# Patient Record
Sex: Female | Born: 1960 | Race: White | Hispanic: No | Marital: Married | Smoking: Never smoker
Health system: Southern US, Community
[De-identification: ages and names within clinical notes are randomized; demographics above are authoritative.]

## PROBLEM LIST (undated history)

## (undated) DIAGNOSIS — C801 Malignant (primary) neoplasm, unspecified: Secondary | ICD-10-CM

## (undated) HISTORY — PX: BREAST SURGERY: SHX581

## (undated) HISTORY — PX: ABDOMINAL HYSTERECTOMY: SHX81

## (undated) HISTORY — PX: GASTRIC BYPASS: SHX52

## (undated) HISTORY — PX: RE-EXCISION OF BREAST CANCER,SUPERIOR MARGINS: SHX6047

## (undated) HISTORY — PX: CARPAL TUNNEL RELEASE: SHX101

---

## 2016-07-03 HISTORY — PX: MASTECTOMY: SHX3

## 2018-01-30 ENCOUNTER — Ambulatory Visit
Admission: EM | Admit: 2018-01-30 | Discharge: 2018-01-30 | Disposition: A | Discharge status: Home / Self Care | Payer: BLUE CROSS/BLUE SHIELD | Attending: Family Medicine | Admitting: Family Medicine

## 2018-01-30 ENCOUNTER — Other Ambulatory Visit: Payer: Self-pay

## 2018-01-30 DIAGNOSIS — J0100 Acute maxillary sinusitis, unspecified: Secondary | ICD-10-CM

## 2018-01-30 MED ORDER — AMOXICILLIN-POT CLAVULANATE 875-125 MG PO TABS: 1.0000 | ORAL_TABLET | Freq: Two times a day (BID) | ORAL | 0 refills | Status: DC

## 2018-01-30 NOTE — Discharge Instructions (Addendum)
Take medication as prescribed. Rest. Drink plenty of fluids.  ° °Follow up with your primary care physician this week as needed. Return to Urgent care for new or worsening concerns.  ° °

## 2018-01-30 NOTE — ED Provider Notes (Signed)
[facility] ____________________________________________  Time seen: Approximately 8:39 AM  I have reviewed the triage vital signs and the nursing notes.   HISTORY  Chief Complaint Otalgia and Sinusitis   HPI Carla Zuniga is a 57 y.o. female presenting for evaluation of 1 week of runny nose, nasal congestion postnasal drainage and some coughing.  Patient reports she has had sinus pressure sensation as well.  States over the last 3 to 4 days symptoms have been worsening and not improving, leading to increased sinus pain and sinus pressure sensation.  Has not been taking any over-the-counter medications regularly.  States that this is something fairly routinely change of seasons.  Unsure if she has seasonal allergies.  Continues to eat and drink well.  States some sore throat.  Continue to remain active.  Denies other aggravating or alleviating factors.Denies chest pain, shortness of breath, abdominal pain, or rash. Denies recent sickness. Denies recent antibiotic use.    History reviewed. No pertinent past medical history.  There are no active problems to display for this patient.   Past Surgical History:  Procedure Laterality Date  . ABDOMINAL HYSTERECTOMY    . CARPAL TUNNEL RELEASE Left   . GASTRIC BYPASS    . MASTECTOMY Right 2018     No current facility-administered medications for this encounter.   Current Outpatient Medications:  .  letrozole (FEMARA) 2.5 MG tablet, , Disp: , Rfl: 10 .  LORazepam (ATIVAN) 1 MG tablet, , Disp: , Rfl: 0 .  omeprazole (PRILOSEC) 20 MG capsule, , Disp: , Rfl: 4 .  amoxicillin-clavulanate (AUGMENTIN) 875-125 MG tablet, Take 1 tablet by mouth every 12 (twelve) hours., Disp: 20 tablet, Rfl: 0  Allergies Patient has no known allergies.  Family History  Problem Relation Age of Onset  . Hypertension Mother   . Diabetes Mother   . Hyperlipidemia Mother   . Hypertension Father   . Diabetes Father   . Hyperlipidemia Father      Social History Social History   Tobacco Use  . Smoking status: Never Smoker  . Smokeless tobacco: Never Used  Substance Use Topics  . Alcohol use: Not Currently  . Drug use: Not Currently    Review of Systems Constitutional: No fever/chills ENT: as above. Cardiovascular: Denies chest pain. Respiratory: Denies shortness of breath. Gastrointestinal: No abdominal pain.  Musculoskeletal: Negative for back pain. Skin: Negative for rash.  ____________________________________________   PHYSICAL EXAM:  VITAL SIGNS: ED Triage Vitals  Enc Vitals Group     BP 01/30/18 0815 (!) 145/65     Pulse Rate 01/30/18 0815 78     Resp 01/30/18 0815 18     Temp 01/30/18 0815 98.5 F (36.9 C)     Temp Source 01/30/18 0815 Oral     SpO2 01/30/18 0815 98 %     Weight 01/30/18 0813 150 lb (68 kg)     Height 01/30/18 0813 5\' 6"  (1.676 m)     Head Circumference --      Peak Flow --      Pain Score 01/30/18 0813 5     Pain Loc --      Pain Edu? --      Excl. in [place]? --     Constitutional: Alert and oriented. Well appearing and in no acute distress. Eyes: Conjunctivae are normal.  Head: Atraumatic.Mild to moderate tenderness to palpation bilateral frontal and maxillary sinuses. No swelling. No erythema.   Ears: no erythema, normal TMs bilaterally.   Nose: nasal congestion  with bilateral nasal turbinate erythema and edema.   Mouth/Throat: Mucous membranes are moist.  Oropharynx non-erythematous.No tonsillar swelling or exudate.  Neck: No stridor.  No cervical spine tenderness to palpation. Hematological/Lymphatic/Immunilogical: No cervical lymphadenopathy. Cardiovascular: Normal rate, regular rhythm. Grossly normal heart sounds.  Good peripheral circulation. Respiratory: Normal respiratory effort.  No retractions.No wheezes, rales or rhonchi. Good air movement.  Musculoskeletal: Steady gait. Neurologic:  Normal speech and language. No gait instability. Skin:  Skin is warm, dry and  intact. No rash noted. Psychiatric: Mood and affect are normal. Speech and behavior are normal.  ___________________________________________   LABS (all labs ordered are listed, but only abnormal results are displayed)  Labs Reviewed - No data to display  PROCEDURES Procedures  INITIAL IMPRESSION / ASSESSMENT AND PLAN / ED COURSE  Pertinent labs & imaging results that were available during my care of the patient were reviewed by me and considered in my medical decision making (see chart for details).  Well-appearing patient.  No acute distress.  Suspect sinusitis.  Will treat with oral Augmentin.  Encourage over-the-counter Claritin and Zyrtec, rest, fluids and supportive care.Discussed indication, risks and benefits of medications with patient.  Discussed follow up with Primary care physician this week. Discussed follow up and return parameters including no resolution or any worsening concerns. Patient verbalized understanding and agreed to plan.   ____________________________________________   FINAL CLINICAL IMPRESSION(S) / ED DIAGNOSES  Final diagnoses:  Acute maxillary sinusitis, recurrence not specified     ED Discharge Orders        Ordered    amoxicillin-clavulanate (AUGMENTIN) 875-125 MG tablet  Every 12 hours     01/30/18 0828       Note: This dictation was prepared with Dragon dictation along with smaller phrase technology. Any transcriptional errors that result from this process are unintentional.         Marylene Land, NP 01/30/18 [phone]

## 2018-01-30 NOTE — ED Triage Notes (Signed)
Patient complains of bilateral ear pain, sinus pain and pressure x 1 week, worsening 4 days ago.

## 2018-03-04 ENCOUNTER — Ambulatory Visit
Admission: EM | Admit: 2018-03-04 | Discharge: 2018-03-04 | Disposition: A | Discharge status: Home / Self Care | Payer: BLUE CROSS/BLUE SHIELD | Attending: Family Medicine | Admitting: Family Medicine

## 2018-03-04 ENCOUNTER — Ambulatory Visit (INDEPENDENT_AMBULATORY_CARE_PROVIDER_SITE_OTHER): Payer: BLUE CROSS/BLUE SHIELD

## 2018-03-04 ENCOUNTER — Encounter: Payer: Self-pay | Admitting: Gynecology

## 2018-03-04 DIAGNOSIS — S9031XA Contusion of right foot, initial encounter: Secondary | ICD-10-CM

## 2018-03-04 HISTORY — DX: Malignant (primary) neoplasm, unspecified: C80.1

## 2018-03-04 MED ORDER — KETOROLAC TROMETHAMINE 60 MG/2ML IM SOLN
60.0000 mg | Freq: Once | INTRAMUSCULAR | Status: AC
  Administered 2018-03-04: 60 mg via INTRAMUSCULAR

## 2018-03-04 NOTE — ED Provider Notes (Signed)
[facility] ____________________________________________  Time seen: Approximately 2:43 PM  I have reviewed the triage vital signs and the nursing notes.   HISTORY  Chief Complaint Toe Injury  HPI Carla Zuniga is a 57 y.o. female presenting for evaluation of right foot pain present since Friday.  States pain is at the base of her second third and fourth toes.  States she was walking outside on Friday and stepped on the area where paver was popped up.  States that soon as she stepped on the paver she had immediate pain to her foot.  Was wearing tennis shoes.  Denies any foreign body entrance.  States pain was predominantly at the bottom of her foot initially, now pain seems more on the top of her foot.  States occasional pain radiation towards her ankle and lower leg when the painful area is touched.  States sometimes these 3 toes feel tingling, no loss of sensation.  Denies any actual pain otherwise to her right leg.  Has applied ice but has continued to remain active and ambulatory on the area.  Did take some over-the-counter Tylenol without much change.  Denies other aggravating or alleviating factors.  Denies other injuries. Denies recent sickness. Denies recent antibiotic use.  Denies known osteoporosis.   Past Medical History:  Diagnosis Date  . Cancer [facility])    breast    There are no active problems to display for this patient.   Past Surgical History:  Procedure Laterality Date  . ABDOMINAL HYSTERECTOMY    . BREAST SURGERY    . CARPAL TUNNEL RELEASE Left   . GASTRIC BYPASS    . MASTECTOMY Right 2018  . RE-EXCISION OF BREAST CANCER,SUPERIOR MARGINS        Current Facility-Administered Medications:  .  ketorolac (TORADOL) injection 60 mg, 60 mg, Intramuscular, Once, Marylene Land, NP  Current Outpatient Medications:  .  ibandronate (BONIVA) 150 MG tablet, Take by mouth., Disp: , Rfl:  .  letrozole (FEMARA) 2.5 MG tablet, , Disp: , Rfl: 10 .  LORazepam  (ATIVAN) 1 MG tablet, , Disp: , Rfl: 0 .  omeprazole (PRILOSEC) 20 MG capsule, , Disp: , Rfl: 4 .  ALBUTEROL SULFATE HFA IN, Inhale into the lungs., Disp: , Rfl:  .  Biotin 5 MG CAPS, Take by mouth., Disp: , Rfl:  .  CALCIUM CITRATE-VITAMIN D3 PO, Take by mouth., Disp: , Rfl:  .  ergocalciferol (DRISDOL) 8000 UNIT/ML drops, Take by mouth., Disp: , Rfl:  .  LACTOBACILLUS RHAMNOSUS, GG, PO, Take by mouth., Disp: , Rfl:   Allergies Patient has no known allergies.  Family History  Problem Relation Age of Onset  . Hypertension Mother   . Diabetes Mother   . Hyperlipidemia Mother   . Hypertension Father   . Diabetes Father   . Hyperlipidemia Father     Social History Social History   Tobacco Use  . Smoking status: Never Smoker  . Smokeless tobacco: Never Used  Substance Use Topics  . Alcohol use: Not Currently  . Drug use: Not Currently    Review of Systems Constitutional: No fever/chills Cardiovascular: Denies chest pain. Respiratory: Denies shortness of breath.  Musculoskeletal: Negative for back pain. AS above.  Skin: Negative for rash.   ____________________________________________   PHYSICAL EXAM:  VITAL SIGNS: ED Triage Vitals  Enc Vitals Group     BP 03/04/18 1401 135/64     Pulse Rate 03/04/18 1401 74     Resp 03/04/18 1401 16  Temp 03/04/18 1401 98 F (36.7 C)     Temp Source 03/04/18 1401 Oral     SpO2 03/04/18 1401 98 %     Weight 03/04/18 1400 155 lb (70.3 kg)     Height 03/04/18 1400 5\' 6"  (1.676 m)     Head Circumference --      Peak Flow --      Pain Score --      Pain Loc --      Pain Edu? --      Excl. in [place]? --     Constitutional: Alert and oriented. Well appearing and in no acute distress. ENT      Head: Normocephalic and atraumatic. Cardiovascular: Normal rate, regular rhythm. Grossly normal heart sounds.  Good peripheral circulation. Respiratory: Normal respiratory effort without tachypnea nor retractions. Breath sounds are clear  and equal bilaterally. No wheezes, rales, rhonchi. Gastrointestinal: Soft and nontender. No distention. Normal Bowel sounds. No CVA tenderness. Musculoskeletal:  Bilateral pedal pulses equal and easily palpated. Except: Right foot at proximal aspect of second third and fourth proximal phalanxes and distal metatarsals moderate tenderness to direct palpation, no edema, skin intact, no erythema, pain with range of motion to these toes, first and fifth toe nontender, normal sensation, right foot and right lower extremity otherwise nontender.  No lower extremity edema noted.  Ambulatory with mild antalgic gait. Neurologic:  Normal speech and language.Speech is normal.  Skin:  Skin is warm, dry and intact. No rash noted. Psychiatric: Mood and affect are normal. Speech and behavior are normal. Patient exhibits appropriate insight and judgment   ___________________________________________   LABS (all labs ordered are listed, but only abnormal results are displayed)  Labs Reviewed - No data to display  RADIOLOGY  Dg Foot Complete Right  Result Date: 03/04/2018 CLINICAL DATA:  Onset right foot pain centered about the second through fourth toes due to an injury on cement steps today. Initial encounter. EXAM: RIGHT FOOT COMPLETE - 3+ VIEW COMPARISON:  None. FINDINGS: There is no evidence of fracture or dislocation. There is no evidence of arthropathy or other focal bone abnormality. Soft tissues are unremarkable. IMPRESSION: Normal exam. Electronically Signed   By: Inge Rise M.D.   On: 03/04/2018 14:27   ____________________________________________   PROCEDURES Procedures    INITIAL IMPRESSION / ASSESSMENT AND PLAN / ED COURSE  Pertinent labs & imaging results that were available during my care of the patient were reviewed by me and considered in my medical decision making (see chart for details).  Well-appearing patient.  No acute distress.  Right foot pain post mechanical injury.  Skin  intact, no foreign body entrance or foreign body visible.  Right foot x-ray as above negative.  Suspect contusion injury.  Postop shoe given.  Previous gastric bypass, 60 mg IM Toradol given single in urgent care.  Over-the-counter Tylenol, rest, ice and follow-up with podiatry as needed.Discussed indication, risks and benefits of medications with patient.  Discussed follow up with Primary care physician this week. Discussed follow up and return parameters including no resolution or any worsening concerns. Patient verbalized understanding and agreed to plan.   ____________________________________________   FINAL CLINICAL IMPRESSION(S) / ED DIAGNOSES  Final diagnoses:  Contusion of right foot, initial encounter     ED Discharge Orders    None       Note: This dictation was prepared with Dragon dictation along with smaller phrase technology. Any transcriptional errors that result from this process are unintentional.  Marylene Land, NP 03/04/18 1749

## 2018-03-04 NOTE — ED Triage Notes (Signed)
Per patient sep on cement pavement with right foot. Per patient with pain to her toes on her right leg. Patient believe she snap her foot / toe on pavement.

## 2018-03-04 NOTE — Discharge Instructions (Addendum)
Ice. Rest. Drink plenty of fluids. Use postoperative shoe.   Follow-up with podiatry as needed for continued pain.  Follow up with your primary care physician this week as needed. Return to Urgent care for new or worsening concerns.

## 2018-11-16 IMAGING — CR DG FOOT COMPLETE 3+V*R*
3 series · 3 of 3 positions shown · non-contrast
Comparison: None.

CLINICAL DATA: Onset right foot pain centered about the second
through fourth toes due to an injury on cement steps today. Initial
encounter.

EXAM:
RIGHT FOOT COMPLETE - 3+ VIEW

[foot ap]
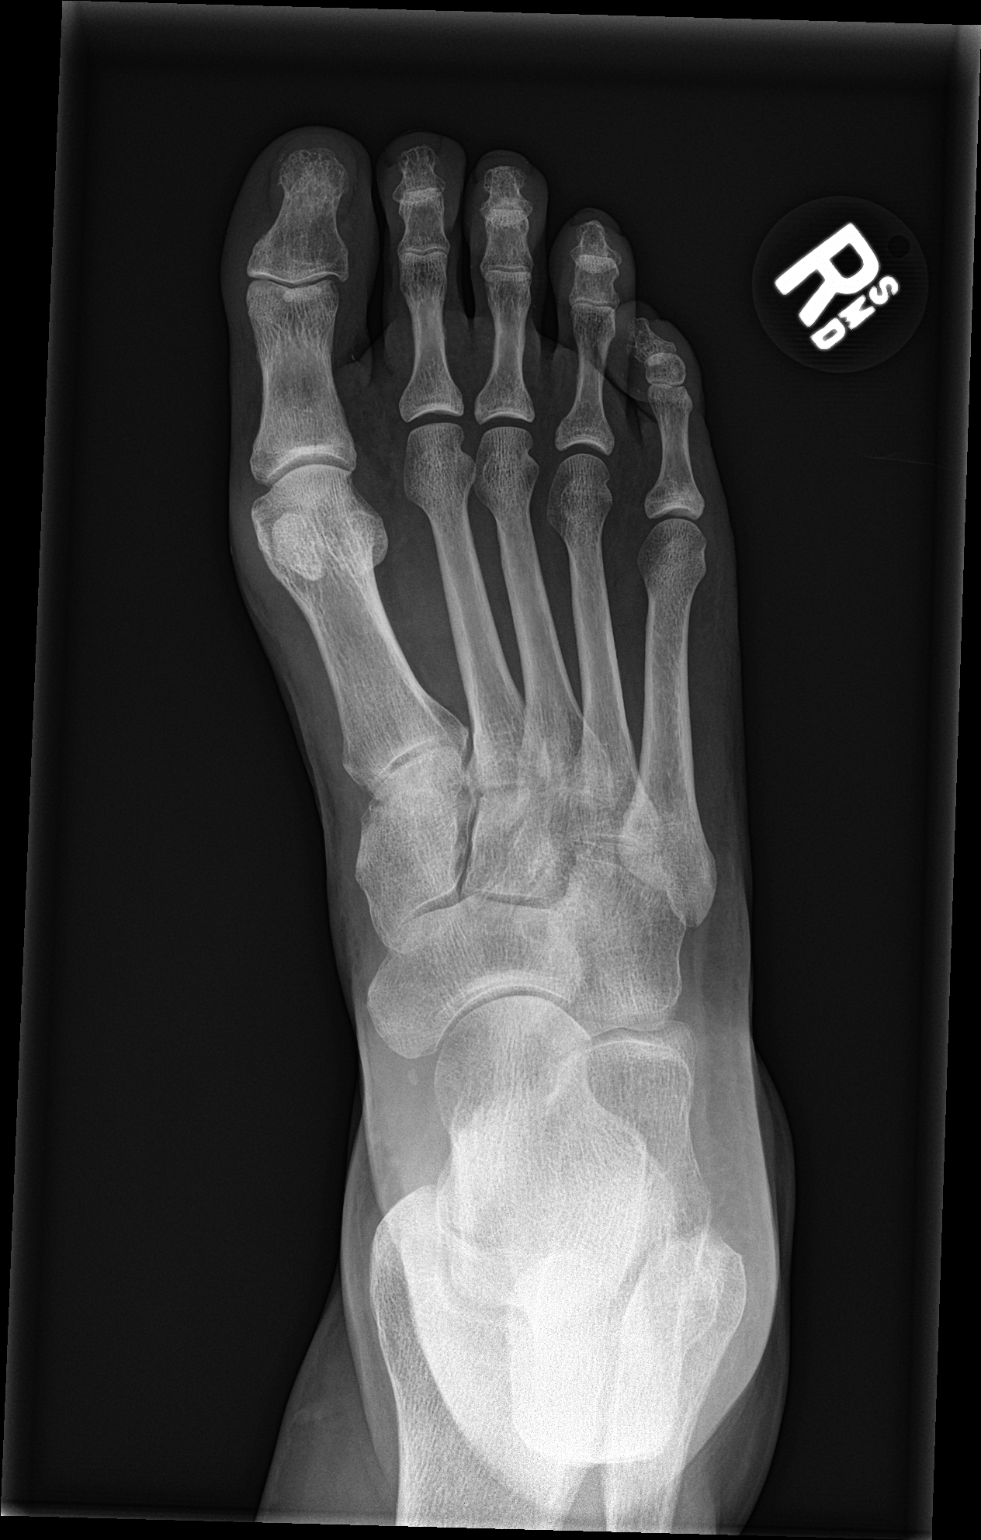

[foot obl]
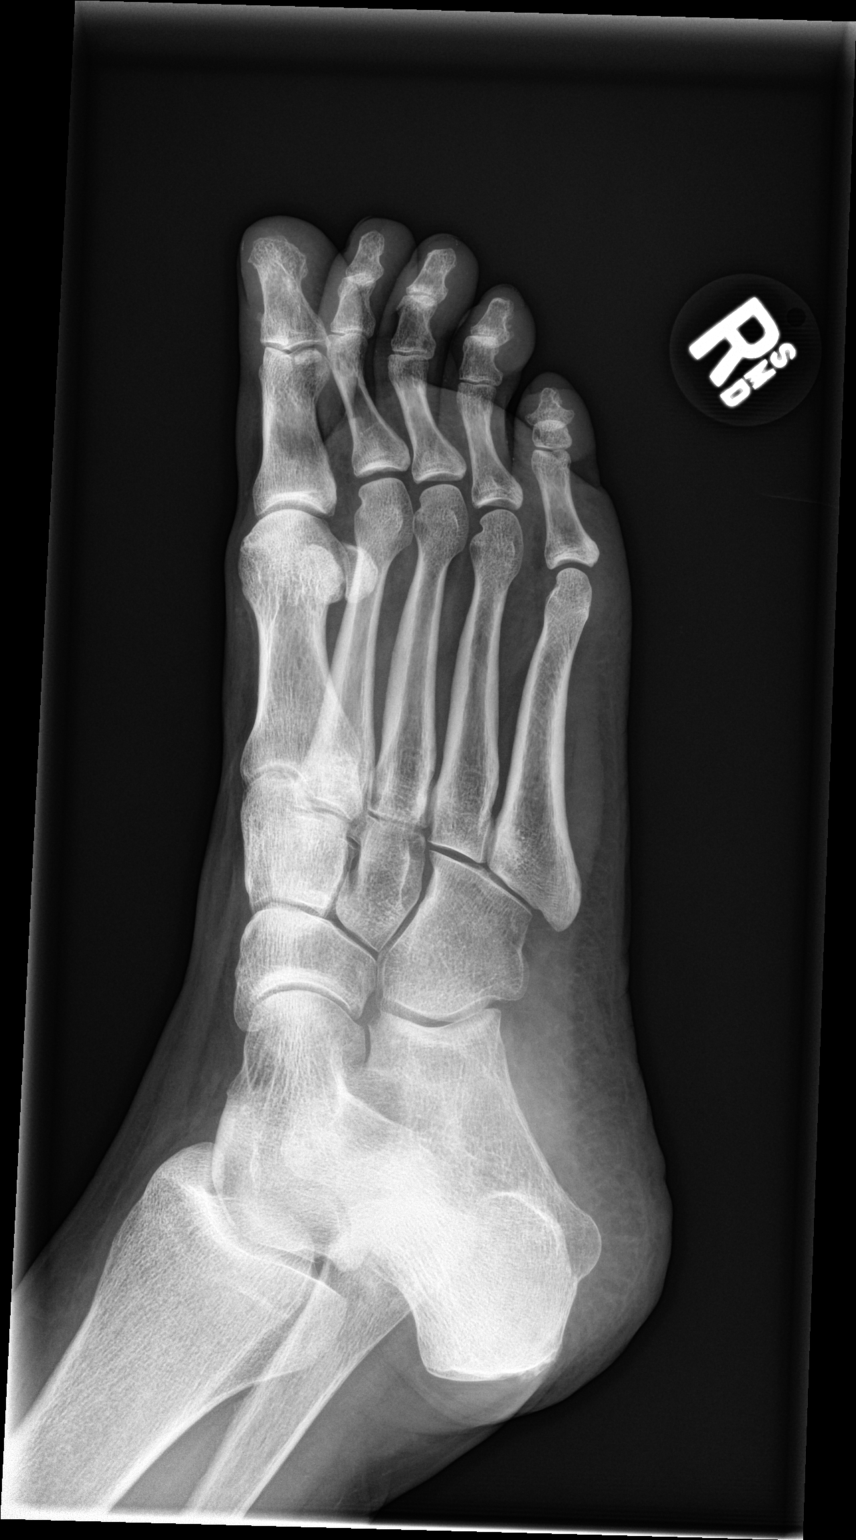

[foot lat]
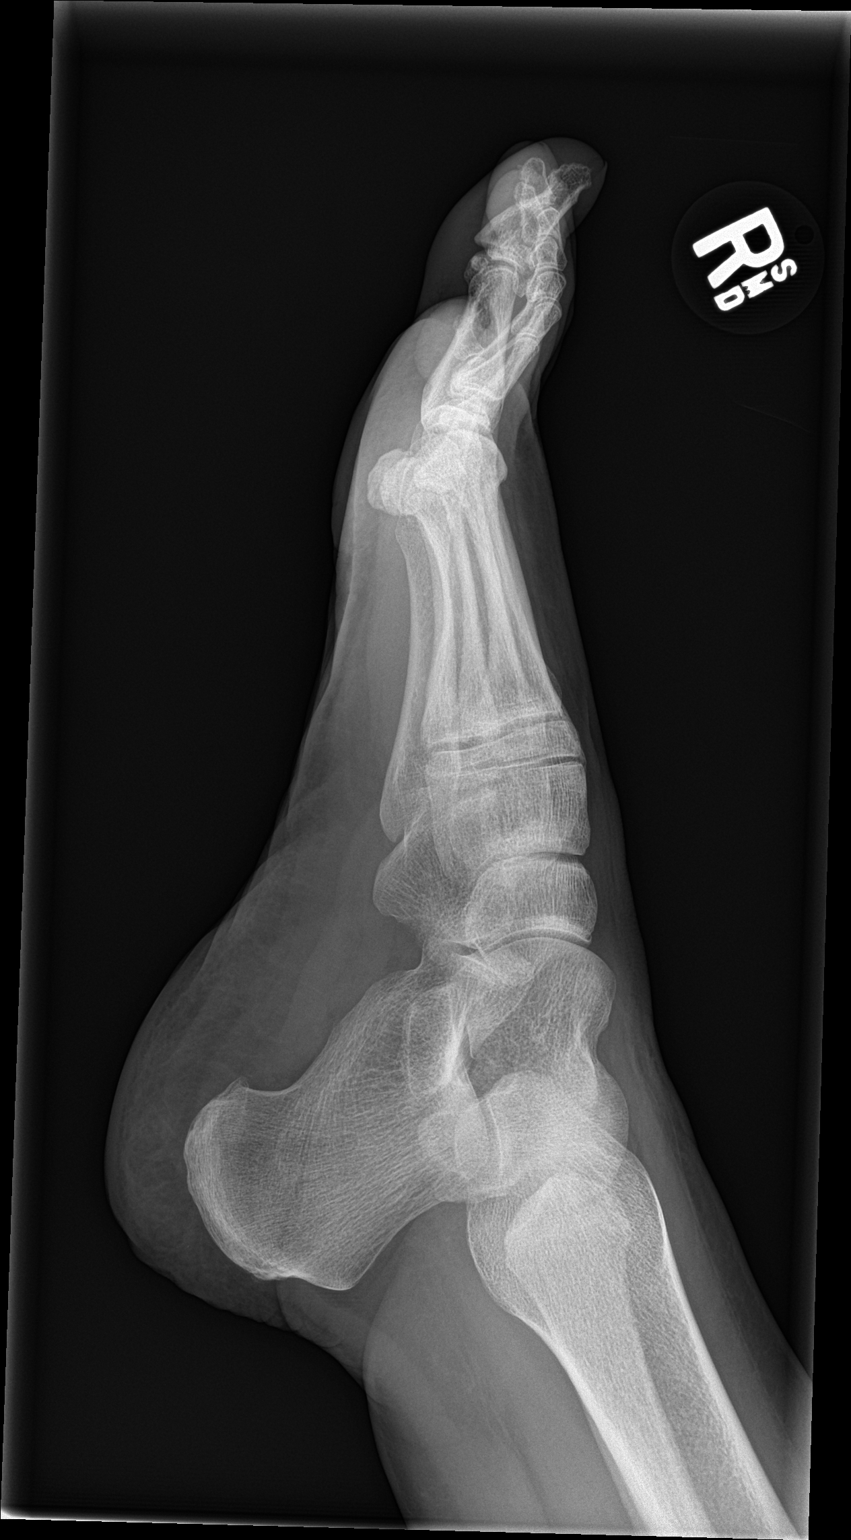

[3 of 3 positions shown; findings below may reference images not displayed]

FINDINGS: There is no evidence of fracture or dislocation. There is no
evidence of arthropathy or other focal bone abnormality. Soft
tissues are unremarkable.
IMPRESSION: Normal exam.

## 2020-03-22 ENCOUNTER — Other Ambulatory Visit: Payer: BLUE CROSS/BLUE SHIELD

## 2020-03-22 ENCOUNTER — Other Ambulatory Visit: Payer: Self-pay

## 2020-03-22 DIAGNOSIS — Z20822 Contact with and (suspected) exposure to covid-19: Secondary | ICD-10-CM

## 2020-03-23 LAB — NOVEL CORONAVIRUS, NAA: SARS-CoV-2, NAA: NOT DETECTED

## 2020-03-23 LAB — SARS-COV-2, NAA 2 DAY TAT

## 2021-06-02 ENCOUNTER — Other Ambulatory Visit: Payer: Self-pay
